# Patient Record
Sex: Female | Born: 1975 | Race: White | Hispanic: No | Marital: Single | Smoking: Never smoker
Health system: Southern US, Community
[De-identification: ages and names within clinical notes are randomized; demographics above are authoritative.]

## PROBLEM LIST (undated history)

## (undated) DIAGNOSIS — F419 Anxiety disorder, unspecified: Secondary | ICD-10-CM

## (undated) DIAGNOSIS — F32A Depression, unspecified: Secondary | ICD-10-CM

## (undated) DIAGNOSIS — Q909 Down syndrome, unspecified: Secondary | ICD-10-CM

## (undated) DIAGNOSIS — F329 Major depressive disorder, single episode, unspecified: Secondary | ICD-10-CM

## (undated) DIAGNOSIS — E119 Type 2 diabetes mellitus without complications: Secondary | ICD-10-CM

---

## 2013-09-03 ENCOUNTER — Encounter (HOSPITAL_COMMUNITY): Payer: Self-pay | Admitting: Emergency Medicine

## 2013-09-03 ENCOUNTER — Emergency Department (HOSPITAL_COMMUNITY)
Admission: EM | Admit: 2013-09-03 | Discharge: 2013-09-03 | Disposition: A | Discharge status: Home / Self Care | Payer: Medicare Other | Attending: Emergency Medicine | Admitting: Emergency Medicine

## 2013-09-03 ENCOUNTER — Emergency Department (HOSPITAL_COMMUNITY): Payer: Medicare Other

## 2013-09-03 DIAGNOSIS — F3289 Other specified depressive episodes: Secondary | ICD-10-CM | POA: Insufficient documentation

## 2013-09-03 DIAGNOSIS — F329 Major depressive disorder, single episode, unspecified: Secondary | ICD-10-CM | POA: Insufficient documentation

## 2013-09-03 DIAGNOSIS — M25552 Pain in left hip: Secondary | ICD-10-CM

## 2013-09-03 DIAGNOSIS — Z79899 Other long term (current) drug therapy: Secondary | ICD-10-CM | POA: Insufficient documentation

## 2013-09-03 DIAGNOSIS — M25559 Pain in unspecified hip: Secondary | ICD-10-CM | POA: Insufficient documentation

## 2013-09-03 DIAGNOSIS — E119 Type 2 diabetes mellitus without complications: Secondary | ICD-10-CM | POA: Insufficient documentation

## 2013-09-03 HISTORY — DX: Type 2 diabetes mellitus without complications: E11.9

## 2013-09-03 HISTORY — DX: Depression, unspecified: F32.A

## 2013-09-03 HISTORY — DX: Major depressive disorder, single episode, unspecified: F32.9

## 2013-09-03 NOTE — ED Provider Notes (Signed)
CSN: [id number]     Arrival date & time 09/03/13  1941 History   First MD Initiated Contact with Patient 09/03/13 2048     This chart was scribed for non-physician practitioner, Rudene AndaJacob Gray Kyna Blahnik PA-C working with Nelia Shiobert L Beaton, MD by Arlan OrganAshley Leger, ED Scribe. This patient was seen in room WTR6/WTR6 and the patient's care was started at 9:17 PM.   Chief Complaint  Patient presents with  . Hip Pain   The history is provided by the patient. No language interpreter was used.    HPI Comments: Sonya Thompson is a 38 y.o. female who presents to the Emergency Department complaining of mild L hip pain x 1 day that has now resolved. She has tried OTC Tylenol with complete relief. Pt feels her discomfort may have been associated with her weight and increased physical activity. At this time she denies any fever, chills, or weakness. Her PMHx includes depression and diabetes mellitus. No other concerns this visit.  Past Medical History  Diagnosis Date  . Depression   . DM (diabetes mellitus)     Taken off metformin; States she does not have anymore   No past surgical history on file. No family history on file. History  Substance Use Topics  . Smoking status: Not on file  . Smokeless tobacco: Not on file  . Alcohol Use: Not on file   OB History   Grav Para Term Preterm Abortions TAB SAB Ect Mult Living                 Review of Systems  A complete 10 system review of systems was obtained and all systems are negative except as noted in the HPI and PMH.    Allergies  Review of patient's allergies indicates no known allergies.  Home Medications   Current Outpatient Rx  Name  Route  Sig  Dispense  Refill  . acetaminophen (TYLENOL) 325 MG tablet   Oral   Take 650 mg by mouth every 6 (six) hours as needed (pain).         . fluvoxaMINE (LUVOX) 50 MG tablet   Oral   Take 50 mg by mouth daily.           Triage Vitals: BP 130/78  Pulse 74  Temp(Src) 98.7 F (37.1 C) (Oral)  SpO2  94%  LMP 09/01/2013   Physical Exam  Nursing note and vitals reviewed. Constitutional: She is oriented to person, place, and time. She appears well-developed and well-nourished. No distress.  HENT:  Head: Normocephalic and atraumatic.  Eyes: Conjunctivae are normal.  Neck: No JVD present. No tracheal deviation present.  Cardiovascular: Normal rate and regular rhythm.  Exam reveals no gallop and no friction rub.   No murmur heard. Pulmonary/Chest: Effort normal. No respiratory distress. She has no wheezes. She has no rhonchi. She has no rales.  Musculoskeletal: Normal range of motion. She exhibits no edema.  No midline tenderness No midline spinal tenderness Negative straight leg raise bilaterally Able to bear weight and ambulate in room   Neurological: She is alert and oriented to person, place, and time.  5/5 distal strength bilaterally Strong sensation bilaterally Patellar reflex intact bilaterally  Skin: Skin is warm and dry. She is not diaphoretic.  Psychiatric: She has a normal mood and affect. Her behavior is normal.    ED Course  Procedures (including critical care time)  DIAGNOSTIC STUDIES: Oxygen Saturation is 94% on RA, adequate by my interpretation.    COORDINATION OF  CARE: 9:26 PM- Will order DG hip complete. Discussed treatment plan with pt at bedside and pt agreed to plan.     Labs Review Labs Reviewed - No data to display Imaging Review Dg Hip Complete Left  09/03/2013   CLINICAL DATA:  Left hip pain.  No injury.  EXAM: LEFT HIP - COMPLETE 2+ VIEW  COMPARISON:  None.  FINDINGS: No fracture. Hip joints are normally space and aligned with no significant arthropathic change seen. SI joints and symphysis pubis are normally space and aligned. Soft tissues are unremarkable. IMPRESSION: Negative.   Electronically Signed   By: Amie Portland M.D.   On: 09/03/2013 21:04     EKG Interpretation None      MDM   Final diagnoses:  Hip pain, left  Plain films  negative. Patient in NAD, and pain is currently resolved. Patient ambulates in ED without assistance or evidence of favoring one leg over another.   Patient appears to have evidence of MR, and was talked in to coming into the ED for evaluation by her Boyfriend who also appears to possess signs of MR. Reassured patient that her hip fine and that should it bother her again, to continue with the tylenol or ibuprofen OTC as directed on bottle in addition to RICE therapy. Patient confirms her understanding. Discharge in good condition.   I personally performed the services described in this documentation, which was scribed in my presence. The recorded information has been reviewed and is accurate.    Allen Norris [place], PA-C 09/06/13 [phone]

## 2013-09-03 NOTE — ED Notes (Signed)
Pt states she has had L hip pain x 1 month. Feels like her weight has something to do with it.

## 2013-09-03 NOTE — Discharge Instructions (Signed)
Rest and ice affected joint as needed for pain. May take over the counter tylenol or ibuprofen for pain as directed on the bottles. Follow up with your doctor if your pain persist. If you do not have a doctor refer to the resource guide below.    Emergency Department Resource Guide 1) Find a Doctor and Pay Out of Pocket Although you won't have to find out who is covered by your insurance plan, it is a good idea to ask around and get recommendations. You will then need to call the office and see if the doctor you have chosen will accept you as a new patient and what types of options they offer for patients who are self-pay. Some doctors offer discounts or will set up payment plans for their patients who do not have insurance, but you will need to ask so you aren't surprised when you get to your appointment.  2) Contact Your Local Health Department Not all health departments have doctors that can see patients for sick visits, but many do, so it is worth a call to see if yours does. If you don't know where your local health department is, you can check in your phone book. The CDC also has a tool to help you locate your state's health department, and many state websites also have listings of all of their local health departments.  3) Find a Walk-in Clinic If your illness is not likely to be very severe or complicated, you may want to try a walk in clinic. These are popping up all over the country in pharmacies, drugstores, and shopping centers. They're usually staffed by nurse practitioners or physician assistants that have been trained to treat common illnesses and complaints. They're usually fairly quick and inexpensive. However, if you have serious medical issues or chronic medical problems, these are probably not your best option.  No Primary Care Doctor: - Call Health Connect at  [phone] - they can help you locate a primary care doctor that  accepts your insurance, provides certain services,  etc. - Physician Referral Service- [phone]  Chronic Pain Problems: Organization         Address  Phone   Notes  [facility]  [phone]) [phone] Patients need to be referred by their primary care doctor.   Medication Assistance: Organization         Address  Phone   Notes  [facility] Medication [facility] [address] [place], [place] [address] [phone] --Must be a resident of [place] -- Must have NO insurance coverage whatsoever (no Medicaid/ Medicare, etc.) -- The pt. MUST have a primary care doctor that directs their care regularly and follows them in the community   MedAssist  [phone]   [employer]  [phone]    Agencies that provide inexpensive medical care: Organization         Address  Phone   Notes  [facility]  [phone]   [facility]    [phone]   [facility] [address] [place], [place] [address] [phone]   [facility] [place] [address], [place] [phone]   Planned Parenthood    [phone]   [facility]    [phone]   [facility]  [address], [place] Phone:  [phone], Fax:  [phone] Hours of Operation:  9 am - 6  pm, M-F.  Also accepts Medicaid/Medicare and self-pay.  [facility]  [address], [place] Phone: [phone], Fax: [phone]) [phone]. Hours of Operation:  8:30 am - 5:30 pm, M-F.  Also accepts Medicaid and self-pay.  [facility] [place] [address], [place] Phone: [phone](336) [phone]   [facility] [address], [place], [place] [phone], Ext. [phone] Mondays & Thursdays: 7-9 AM.  First 15 patients are seen on a first come, first serve basis.    Medicaid-accepting [facility] [place] Providers:  Organization         Address  Phone   Notes  [facility] [address], [place] [phone]) [phone] Also accepts self-pay patients.  [facility] [address] [place], [place]  [phone]) [phone]   [facility] [address], [place] [phone]) [phone]   [facility] [address] [place] [address], [place] [phone](336) [phone]   Renaye RakersVeita Bland [address], [place]   [phone] Only accepts [place] Access [place] patients after they have their name applied to their card.   Self-Pay (no insurance) in [facility]:  Organization         Address  Phone   Notes  Sickle Cell Patients, [facility] [address], [place] [phone]   [facility] [address], [place] [phone]   [facility]  [address], [place] [phone]   [facility]/Dr. Osei-Bonsu  [address] [place] [address], [place] or [id number], [place] [phone] Phone number for both [place] and [place] locations is the same.  [facility] [address], [place] [phone]   [facility] [address] [place] [address], [place] or [address] [phone]   [facility] [address], [place] [phone], phone; [phone], fax Sees patients 1st and 3rd Saturday of every month.  Must not qualify for public or private insurance (i.e. Medicaid, Medicare, [place] Health Choice, Veterans' Benefits)  Household income should be no more than 200% of the poverty level The clinic cannot treat you if you are pregnant or think you are pregnant  Sexually transmitted diseases are not treated at the clinic.    Dental Care: Organization         Address  Phone  Notes  [facility] [address], [place] [phone]) [phone] Accepts children up to  age 37 who are enrolled in [place]icaid or [place] Health Choice; pregnant women with a Medicaid card; and children who have applied for Medicaid or [place] Health Choice, but were declined, whose parents can pay a reduced fee at time of service.  [facility] of [facility] [place]  [address], [place] [phone]) [phone] Accepts children up to age 37 who are enrolled in [place]icaid or [place] Health Choice; pregnant women with a Medicaid card; and children who have applied for Medicaid or  Health Choice, but were declined, whose parents can pay a reduced fee at time of service.  [facility]  [address], [place] [phone]) [phone] Patients are seen by appointment only. Walk-ins are not accepted. [facility] will see patients 37 years of age and older.  Monday - Tuesday (8am-5pm) Most Wednesdays (8:30-5pm) $30 per visit, cash only  [facility]  [address], [facility] [phone] Patients are seen by appointment only. Walk-ins are not accepted. [facility] will see patients 37 years of age and older. One Wednesday Evening (Monthly: Volunteer Based).  $30 per visit, cash only  [facility]  [phone] for adults; Children under age 484, call Graduate Pediatric Dentistry at [phone]) [phone]. Children aged 924-14, please call [phone]) [phone] to request a pediatric application.  Dental services are provided in all areas of dental care including fillings, crowns and bridges, complete and partial dentures, implants, gum treatment, root canals, and extractions. Preventive care is also provided. Treatment is provided to both adults and children. Patients are selected via a lottery and there is often a waiting list.   [facility] [address], [place]  [phone]) [phone] [email or web address]   [facility] [address], [place], [place] [phone], Ext. [phone] Second and Fourth Thursday of  each month, opens at 6:30 AM; Clinic ends at 9 AM.  Patients are seen on a first-come first-served basis, and a limited number are seen during each clinic.   [facility]  [address], [place], [place] [phone]) [phone]   Eligibility Requirements You must have lived in [place], [place], or [place] counties for at least the last three months.   You cannot be eligible for state or federal sponsored National Cityhealthcare insurance, including CIGNAVeterans Administration, [place]icaid, or [employer] EntertainmentMedicare.   You generally cannot be eligible for healthcare insurance through your employer.    How to apply: Eligibility screenings are held every Tuesday and Wednesday afternoon from 1:00 pm until 4:00 pm. You do not need an appointment for the interview!  [facility] [address], [place], [place] [phone]   [facility]  [phone]   [facility] Health [facility]  [phone]   [facility]  [phone]    Behavioral Health Resources in the Community: Intensive Outpatient Programs Organization         Address  Phone  Notes  [facility] [address], [place], [place] [phone]   [facility] Outpatient [address], [place], [place] [phone]   [facility] [address], [place], [place]  [phone]   [facility] [address],  [place], [place] [phone] or [phone]   Substance Abuse Resources Organization         Address  Phone  Notes  Al[facility] Drug Services  [phone]   [facility]  [phone]   [facility]  [phone]   [facility]  [phone]   Residential & Outpatient Substance Abuse Program  [phone]   Psychological Services Organization         Address  Phone  Notes  [facility]  [phone]- [phone]   [facility]  [phone]- [phone]   [facility] [address],  [place] [phone] or [phone]    Mobile Crisis Teams Organization         Address  Phone  Notes  [facility], Mobile Crisis Care Unit  [phone]   [facility]  [address] [place], [place] [phone]   Doristine LocksSharon DeEsch [address] [place] [phone]    Self-Help/Support Groups Organization         Address  Phone  Notes  [facility] of [place] - variety of support groups  [place] Call for more information  Narcotics Anonymous (NA), Caring Services [address], [employer] [place]  2 meetings at this location   Special educational needs teacher         Address  Phone  Notes  ASAP Residential Treatment [place],    [place]  [phone]   [facility]  [address], [place] [phone], [place], [place]   [place], [place] [phone] Admissions: 8am-3pm M-F  Incentives Substance [place] [address],    [place], [place] [phone]   [facility] [address] [place], [place], [place]   [facility] [address]  [place], [place]   Insight Programs - Intensive Outpatient [address], [place], [place]   [facility] ([place].,  [place], [place] [phone] or [phone]   [facility] (RTS) [address] [place], [place] Accepts Medicaid  [facility] [place] [address],  [place] [phone] Substance Abuse/Addiction Treatment   [facility] Organization         Address  Phone  Notes  [facility]  [phone]   Domenic Schwab, PhD [address] [place], [place]   [phone] or [phone]   [address]sey [address] [place], [place] [phone]     [facility] [address], [place], [place] [phone] Insurance/Medicaid/sponsorship through [facility] and Families [address], [address]                                    [place], [place] [phone] [place] [address], [place] [phone]    Dr. Adele Schilder  [phone]   [facility]. 1) [address], [place] 2) [place] 3)  [address], [place] [phone]  [phone]   [place] [phone] or [phone] (After Hours)

## 2013-09-08 NOTE — ED Provider Notes (Signed)
Medical screening examination/treatment/procedure(s) were performed by non-physician practitioner and as supervising physician I was immediately available for consultation/collaboration.   Luv Mish L Gladyce Mcray, MD 09/08/13 1121 

## 2013-11-09 ENCOUNTER — Emergency Department (HOSPITAL_COMMUNITY)
Admission: EM | Admit: 2013-11-09 | Discharge: 2013-11-09 | Disposition: A | Discharge status: Home / Self Care | Payer: Medicare Other | Attending: Emergency Medicine | Admitting: Emergency Medicine

## 2013-11-09 ENCOUNTER — Encounter (HOSPITAL_COMMUNITY): Payer: Self-pay | Admitting: Emergency Medicine

## 2013-11-09 ENCOUNTER — Emergency Department (INDEPENDENT_AMBULATORY_CARE_PROVIDER_SITE_OTHER)
Admission: EM | Admit: 2013-11-09 | Discharge: 2013-11-09 | Disposition: A | Discharge status: Hospice | Payer: Medicare Other | Source: Home / Self Care | Attending: Emergency Medicine | Admitting: Emergency Medicine

## 2013-11-09 DIAGNOSIS — F419 Anxiety disorder, unspecified: Secondary | ICD-10-CM

## 2013-11-09 DIAGNOSIS — E119 Type 2 diabetes mellitus without complications: Secondary | ICD-10-CM | POA: Insufficient documentation

## 2013-11-09 DIAGNOSIS — F411 Generalized anxiety disorder: Secondary | ICD-10-CM | POA: Insufficient documentation

## 2013-11-09 DIAGNOSIS — F3289 Other specified depressive episodes: Secondary | ICD-10-CM | POA: Insufficient documentation

## 2013-11-09 DIAGNOSIS — T7421XA Adult sexual abuse, confirmed, initial encounter: Secondary | ICD-10-CM

## 2013-11-09 DIAGNOSIS — Q909 Down syndrome, unspecified: Secondary | ICD-10-CM | POA: Insufficient documentation

## 2013-11-09 DIAGNOSIS — F329 Major depressive disorder, single episode, unspecified: Secondary | ICD-10-CM | POA: Insufficient documentation

## 2013-11-09 DIAGNOSIS — IMO0002 Reserved for concepts with insufficient information to code with codable children: Secondary | ICD-10-CM

## 2013-11-09 DIAGNOSIS — Z79899 Other long term (current) drug therapy: Secondary | ICD-10-CM | POA: Insufficient documentation

## 2013-11-09 HISTORY — DX: Down syndrome, unspecified: Q90.9

## 2013-11-09 HISTORY — DX: Anxiety disorder, unspecified: F41.9

## 2013-11-09 LAB — CBC WITH DIFFERENTIAL/PLATELET
Basophils Absolute: 0.1 10*3/uL (ref 0.0–0.1)
Basophils Relative: 1 % (ref 0–1)
Eosinophils Absolute: 0.1 10*3/uL (ref 0.0–0.7)
Eosinophils Relative: 1 % (ref 0–5)
HEMATOCRIT: 42.1 % (ref 36.0–46.0)
HEMOGLOBIN: 14.2 g/dL (ref 12.0–15.0)
LYMPHS PCT: 17 % (ref 12–46)
Lymphs Abs: 1.7 10*3/uL (ref 0.7–4.0)
MCH: 29.6 pg (ref 26.0–34.0)
MCHC: 33.7 g/dL (ref 30.0–36.0)
MCV: 87.9 fL (ref 78.0–100.0)
MONOS PCT: 6 % (ref 3–12)
Monocytes Absolute: 0.6 10*3/uL (ref 0.1–1.0)
NEUTROS ABS: 7.7 10*3/uL (ref 1.7–7.7)
Neutrophils Relative %: 75 % (ref 43–77)
Platelets: 324 10*3/uL (ref 150–400)
RBC: 4.79 MIL/uL (ref 3.87–5.11)
RDW: 14.8 % (ref 11.5–15.5)
WBC: 10.1 10*3/uL (ref 4.0–10.5)

## 2013-11-09 LAB — I-STAT CHEM 8, ED
BUN: 13 mg/dL (ref 6–23)
CALCIUM ION: 1.13 mmol/L (ref 1.12–1.23)
CHLORIDE: 101 meq/L (ref 96–112)
Creatinine, Ser: 0.9 mg/dL (ref 0.50–1.10)
GLUCOSE: 128 mg/dL — AB (ref 70–99)
HCT: 48 % — ABNORMAL HIGH (ref 36.0–46.0)
Hemoglobin: 16.3 g/dL — ABNORMAL HIGH (ref 12.0–15.0)
POTASSIUM: 3.6 meq/L — AB (ref 3.7–5.3)
Sodium: 142 mEq/L (ref 137–147)
TCO2: 29 mmol/L (ref 0–100)

## 2013-11-09 MED ORDER — LORAZEPAM 1 MG PO TABS
1.0000 mg | ORAL_TABLET | Freq: Once | ORAL | Status: AC
  Administered 2013-11-09: 1 mg via ORAL
  Filled 2013-11-09: qty 1
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
## 2013-11-09 NOTE — ED Notes (Addendum)
Sane Nurse has arrived, Laurell Josephsheryl Anderson, RN
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
## 2013-11-09 NOTE — ED Notes (Signed)
Pt denies any sx at this time, reports earlier was feeling very anxious and felt heart racing. Pt says she has calmed down and heart is no longer racing.
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
## 2013-11-09 NOTE — ED Notes (Signed)
[facility]. She states she was just going in for a pregnancy test. She and her boyfriend are both special needs. They state they are afraid her parents are going to make her move to [place] today and they dont want to be separated
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
## 2013-11-09 NOTE — ED Notes (Signed)
C/o possible pregnancy Wants pregnancy test

## 2013-11-09 NOTE — Discharge Instructions (Signed)
Avoid stressors. Drink plenty of fluids. Follow up with primary care doctor.    Panic Attacks Panic attacks are sudden, short-livedsurges of severe anxiety, fear, or discomfort. They may occur for no reason when you are relaxed, when you are anxious, or when you are sleeping. Panic attacks may occur for a number of reasons:   Healthy people occasionally have panic attacks in extreme, life-threatening situations, such as war or natural disasters. Normal anxiety is a protective mechanism of the body that helps us react to danger (fight or flight response).  Panic attacks are often seen with anxiety disorders, such as panic disorder, social anxiety disorder, generalized anxiety disorder, and phobias. Anxiety disorders cause excessive or uncontrollable anxiety. They may interfere with your relationships or other life activities.  Panic attacks are sometimes seen with other mental illnesses such as depression and posttraumatic stress disorder.  Certain medical conditions, prescription medicines, and drugs of abuse can cause panic attacks. SYMPTOMS  Panic attacks start suddenly, peak within 20 minutes, and are accompanied by four or more of the following symptoms:  Pounding heart or fast heart rate (palpitations).  Sweating.  Trembling or shaking.  Shortness of breath or feeling smothered.  Feeling choked.  Chest pain or discomfort.  Nausea or strange feeling in your stomach.  Dizziness, lightheadedness, or feeling like you will faint.  Chills or hot flushes.  Numbness or tingling in your lips or hands and feet.  Feeling that things are not real or feeling that you are not yourself.  Fear of losing control or going crazy.  Fear of dying. Some of these symptoms can mimic serious medical conditions. For example, you may think you are having a heart attack. Although panic attacks can be very scary, they are not life threatening. DIAGNOSIS  Panic attacks are diagnosed through an  assessment by your health care provider. Your health care provider will ask questions about your symptoms, such as where and when they occurred. Your health care provider will also ask about your medical history and use of alcohol and drugs, including prescription medicines. Your health care provider may order blood tests or other studies to rule out a serious medical condition. Your health care provider may refer you to a mental health professional for further evaluation. TREATMENT   Most healthy people who have one or two panic attacks in an extreme, life-threatening situation will not require treatment.  The treatment for panic attacks associated with anxiety disorders or other mental illness typically involves counseling with a mental health professional, medicine, or a combination of both. Your health care provider will help determine what treatment is best for you.  Panic attacks due to physical illness usually goes away with treatment of the illness. If prescription medicine is causing panic attacks, talk with your health care provider about stopping the medicine, decreasing the dose, or substituting another medicine.  Panic attacks due to alcohol or drug abuse goes away with abstinence. Some adults need professional help in order to stop drinking or using drugs. HOME CARE INSTRUCTIONS   Take all your medicines as prescribed.   Check with your health care provider before starting new prescription or over-the-counter medicines.  Keep all follow up appointments with your health care provider. SEEK MEDICAL CARE IF:  You are not able to take your medicines as prescribed.  Your symptoms do not improve or get worse. SEEK IMMEDIATE MEDICAL CARE IF:   You experience panic attack symptoms that are different than your usual symptoms.  You have serious  thoughts about hurting yourself or others.  You are taking medicine for panic attacks and have a serious side effect. MAKE SURE  YOU:  Understand these instructions.  Will watch your condition.  Will get help right away if you are not doing well or get worse. Document Released: 05/17/2005 Document Revised: 03/07/2013 Document Reviewed: 12/29/2012 [facility] Patient Information 2014 [employer] [place].
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
## 2013-11-09 NOTE — ED Provider Notes (Signed)
Medical screening examination/treatment/procedure(s) were performed by non-physician practitioner and as supervising physician I was immediately available for consultation/collaboration.  Kobey Sides, M.D.  Tamlyn Sides C Shatonia Hoots, MD 11/09/13 2142 

## 2013-11-09 NOTE — ED Notes (Signed)
Pt reports watching the heart monitor was making her anxious which made her HR go up into the 120s, so she took off the EKG leads and the stickers. Pt says she continues to feel anxious but is no longer feeling her heart race.

## 2013-11-09 NOTE — ED Notes (Signed)
Laurell Josephsheryl Anderson, RN Sane Nurse, discharged pt.
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
## 2013-11-10 NOTE — ED Provider Notes (Signed)
Medical screening examination/treatment/procedure(s) were performed by non-physician practitioner and as supervising physician I was immediately available for consultation/collaboration.   EKG Interpretation None        David H Yao, MD 11/10/13 1504 

## 2015-09-06 IMAGING — CR DG HIP (WITH OR WITHOUT PELVIS) 2-3V*L*
3 series · 3 of 3 positions shown · non-contrast
Comparison: None.

CLINICAL DATA: Left hip pain.  No injury.

EXAM:
LEFT HIP - COMPLETE 2+ VIEW

[t pelvis ap]
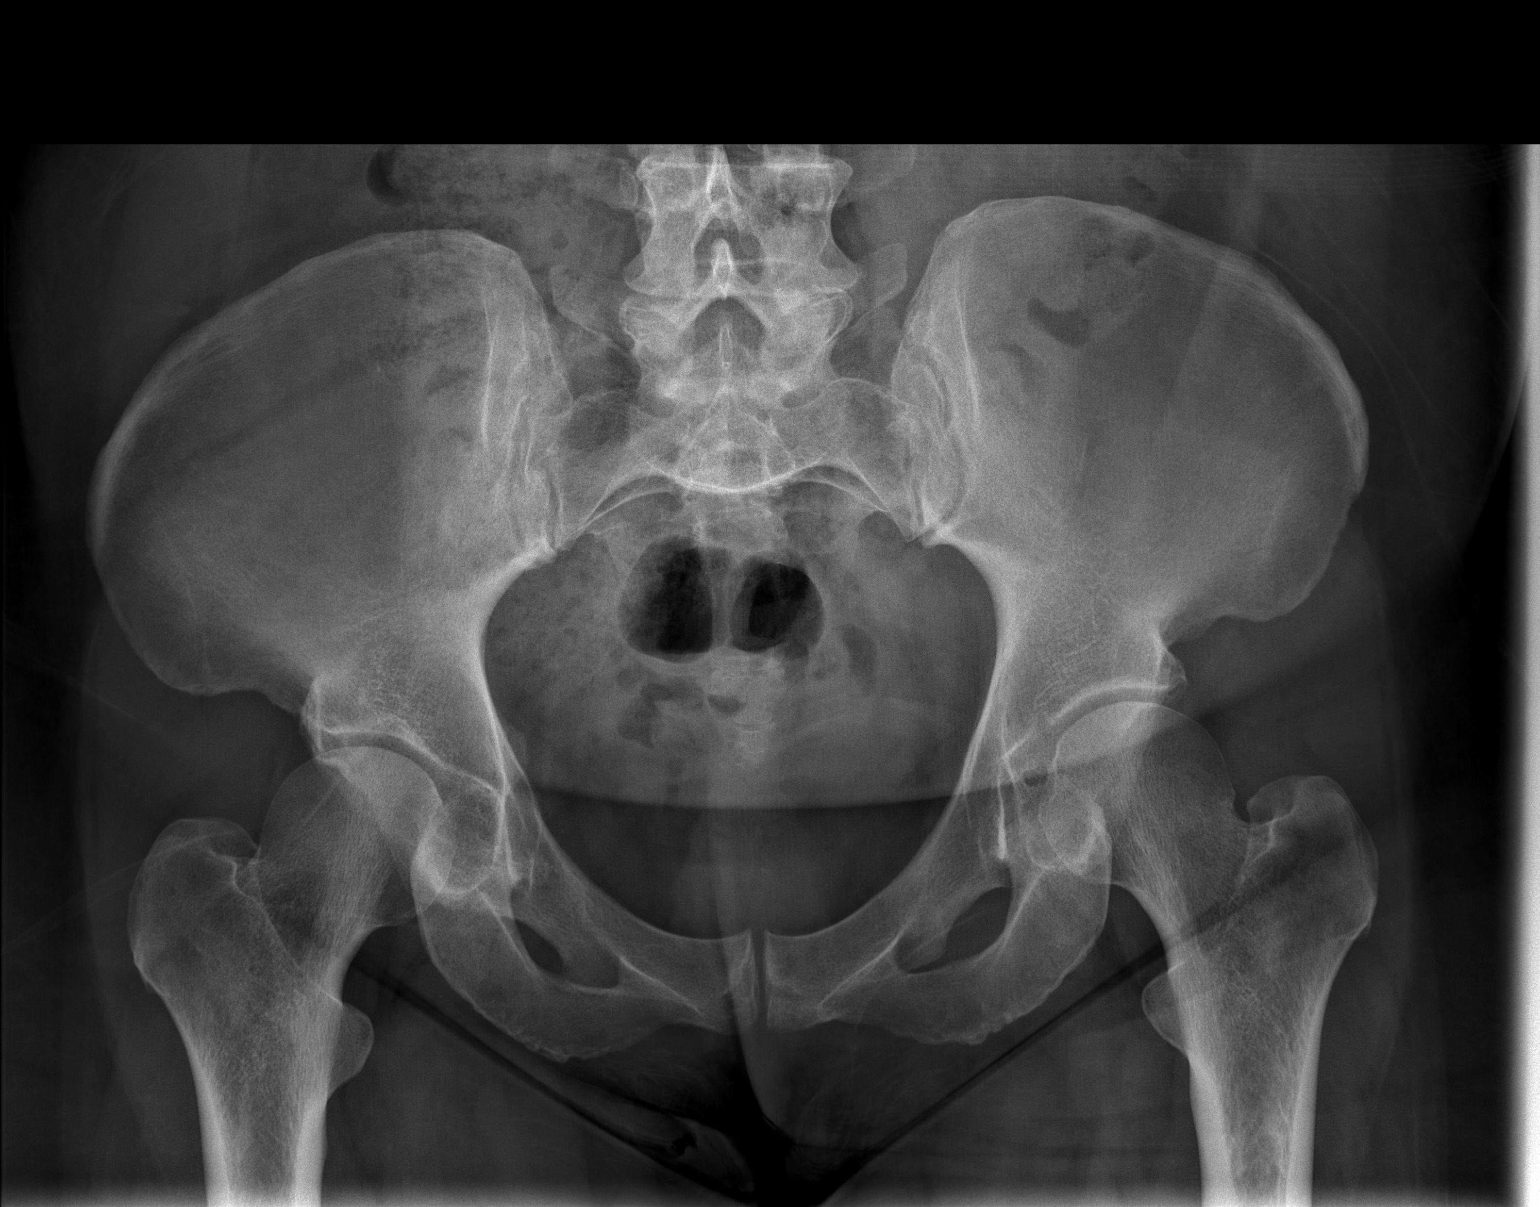

[t hip ap left]
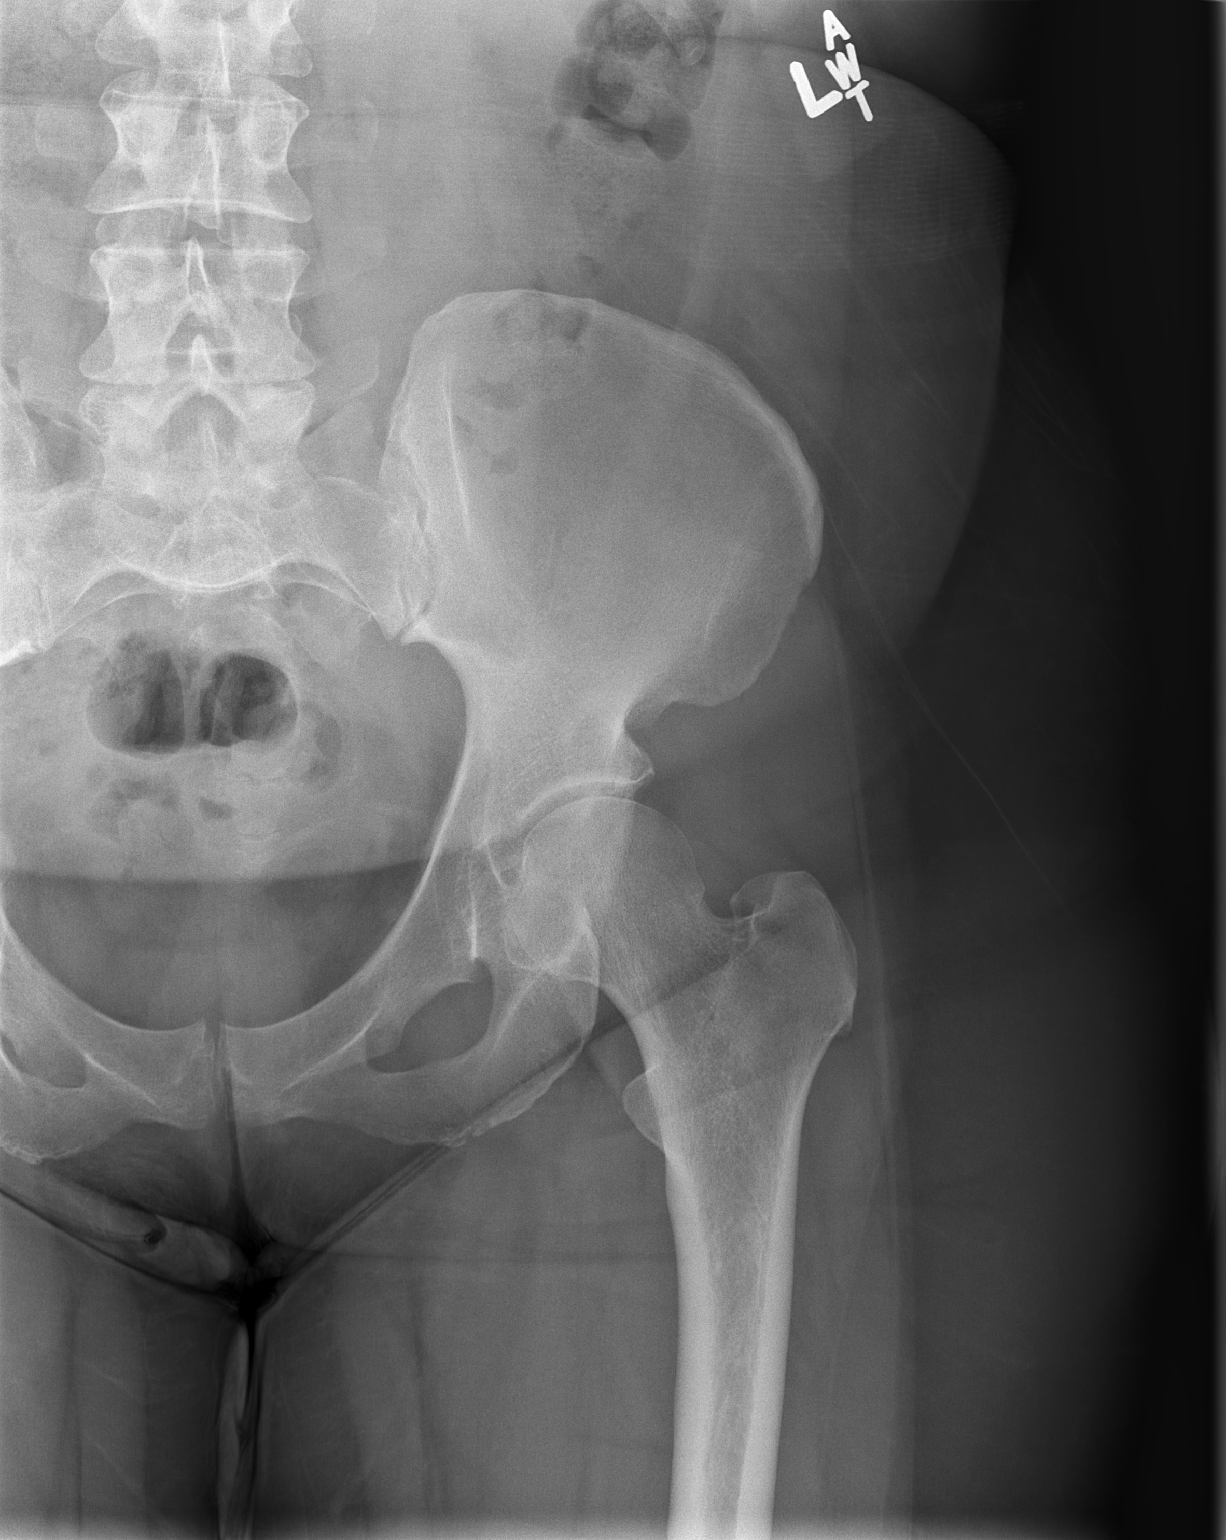

[t hip frog leg left]
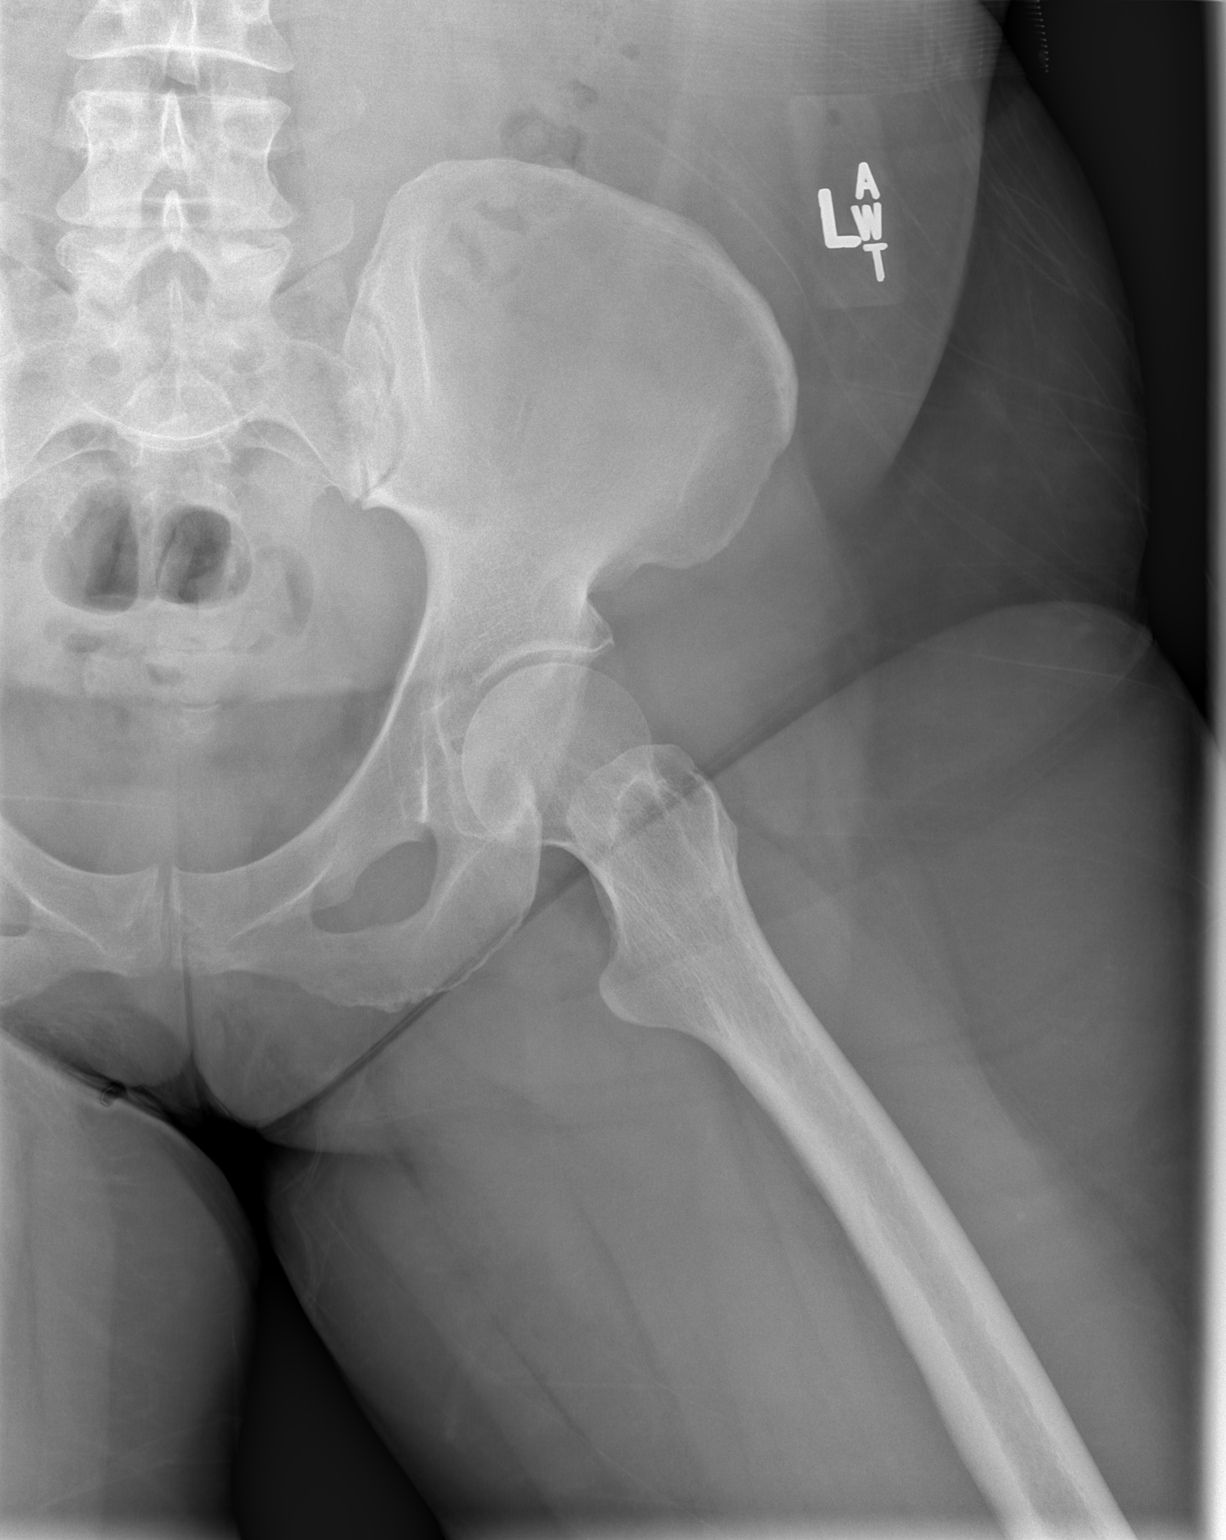

[3 of 3 positions shown; findings below may reference images not displayed]

FINDINGS: No fracture. Hip joints are normally space and aligned with no
significant arthropathic change seen. SI joints and symphysis pubis
are normally space and aligned. Soft tissues are unremarkable.
IMPRESSION: Negative.
